# Patient Record
Sex: Male | Born: 1960 | Race: Black or African American | Hispanic: No | Smoking: Former smoker
Health system: Southern US, Community
[De-identification: ages and names within clinical notes are randomized; demographics above are authoritative.]

## PROBLEM LIST (undated history)

## (undated) DIAGNOSIS — I82409 Acute embolism and thrombosis of unspecified deep veins of unspecified lower extremity: Secondary | ICD-10-CM

## (undated) DIAGNOSIS — C801 Malignant (primary) neoplasm, unspecified: Secondary | ICD-10-CM

## (undated) DIAGNOSIS — I10 Essential (primary) hypertension: Secondary | ICD-10-CM

## (undated) DIAGNOSIS — C3490 Malignant neoplasm of unspecified part of unspecified bronchus or lung: Secondary | ICD-10-CM

## (undated) DIAGNOSIS — R569 Unspecified convulsions: Secondary | ICD-10-CM

## (undated) HISTORY — PX: HIP SURGERY: SHX245

---

## 2016-09-01 ENCOUNTER — Emergency Department: Payer: Medicaid Other

## 2016-09-01 ENCOUNTER — Emergency Department
Admission: EM | Admit: 2016-09-01 | Discharge: 2016-09-02 | Disposition: A | Discharge status: Other Acute Inpatient Hospital | Payer: Medicaid Other | Attending: Emergency Medicine | Admitting: Emergency Medicine

## 2016-09-01 DIAGNOSIS — R569 Unspecified convulsions: Secondary | ICD-10-CM | POA: Insufficient documentation

## 2016-09-01 DIAGNOSIS — Z5181 Encounter for therapeutic drug level monitoring: Secondary | ICD-10-CM | POA: Insufficient documentation

## 2016-09-01 DIAGNOSIS — Z87891 Personal history of nicotine dependence: Secondary | ICD-10-CM | POA: Insufficient documentation

## 2016-09-01 DIAGNOSIS — C7931 Secondary malignant neoplasm of brain: Secondary | ICD-10-CM | POA: Diagnosis not present

## 2016-09-01 DIAGNOSIS — G936 Cerebral edema: Secondary | ICD-10-CM | POA: Diagnosis not present

## 2016-09-01 DIAGNOSIS — Z85118 Personal history of other malignant neoplasm of bronchus and lung: Secondary | ICD-10-CM | POA: Insufficient documentation

## 2016-09-01 DIAGNOSIS — I10 Essential (primary) hypertension: Secondary | ICD-10-CM | POA: Diagnosis not present

## 2016-09-01 HISTORY — DX: Acute embolism and thrombosis of unspecified deep veins of unspecified lower extremity: I82.409

## 2016-09-01 HISTORY — DX: Malignant neoplasm of unspecified part of unspecified bronchus or lung: C34.90

## 2016-09-01 HISTORY — DX: Essential (primary) hypertension: I10

## 2016-09-01 HISTORY — DX: Malignant (primary) neoplasm, unspecified: C80.1

## 2016-09-01 LAB — GLUCOSE, CAPILLARY: GLUCOSE-CAPILLARY: 205 mg/dL — AB (ref 65–99)

## 2016-09-01 MED ORDER — IPRATROPIUM-ALBUTEROL 0.5-2.5 (3) MG/3ML IN SOLN
3.0000 mL | Freq: Once | RESPIRATORY_TRACT | Status: AC
  Administered 2016-09-01: 3 mL via RESPIRATORY_TRACT

## 2016-09-01 MED ORDER — LORAZEPAM 2 MG/ML IJ SOLN
2.0000 mg | Freq: Once | INTRAMUSCULAR | Status: AC
  Administered 2016-09-01: 2 mg via INTRAVENOUS

## 2016-09-01 MED ORDER — LORAZEPAM 2 MG/ML IJ SOLN: 2.0000 mg | Freq: Once | INTRAMUSCULAR | Status: DC

## 2016-09-01 NOTE — ED Triage Notes (Signed)
Pt from home via [facility] with status epilepticus. Pt is a stage IV lung cancer pt who has recently been experiencing seizures. Per EMS, pt was administered '2mg'$  Versed intranasally and '4mg'$  Versed IM in the left deltoid. EMS IV attempts x2 on left arm. EMS also reports CBG od 224 and BP 170/100. At this time, pt in unresponsive to verbal stimulation but responsive to pain.

## 2016-09-02 ENCOUNTER — Encounter: Payer: Self-pay | Admitting: Emergency Medicine

## 2016-09-02 LAB — URINALYSIS, ROUTINE W REFLEX MICROSCOPIC
Bilirubin Urine: NEGATIVE
GLUCOSE, UA: NEGATIVE mg/dL
Hgb urine dipstick: NEGATIVE
Ketones, ur: NEGATIVE mg/dL
LEUKOCYTES UA: NEGATIVE
Nitrite: NEGATIVE
PH: 6 (ref 5.0–8.0)
PROTEIN: NEGATIVE mg/dL
SPECIFIC GRAVITY, URINE: 1.013 (ref 1.005–1.030)

## 2016-09-02 LAB — BLOOD GAS, ARTERIAL
ACID-BASE DEFICIT: 3.2 mmol/L — AB (ref 0.0–2.0)
Bicarbonate: 22.7 mmol/L (ref 20.0–28.0)
Delivery systems: POSITIVE
Expiratory PAP: 5
FIO2: 0.3
Inspiratory PAP: 10
O2 SAT: 92.2 %
PATIENT TEMPERATURE: 37
PCO2 ART: 43 mmHg (ref 32.0–48.0)
PO2 ART: 69 mmHg — AB (ref 83.0–108.0)
pH, Arterial: 7.33 — ABNORMAL LOW (ref 7.350–7.450)

## 2016-09-02 LAB — COMPREHENSIVE METABOLIC PANEL
ALBUMIN: 2.2 g/dL — AB (ref 3.5–5.0)
ALK PHOS: 50 U/L (ref 38–126)
ALT: 14 U/L — AB (ref 17–63)
AST: 30 U/L (ref 15–41)
Anion gap: 12 (ref 5–15)
BUN: 6 mg/dL (ref 6–20)
CALCIUM: 6.2 mg/dL — AB (ref 8.9–10.3)
CHLORIDE: 120 mmol/L — AB (ref 101–111)
CO2: 12 mmol/L — AB (ref 22–32)
CREATININE: 0.59 mg/dL — AB (ref 0.61–1.24)
GFR calc Af Amer: >60 mL/min (ref >60)
GFR calc non Af Amer: >60 mL/min (ref >60)
GLUCOSE: 166 mg/dL — AB (ref 65–99)
Potassium: 2 mmol/L — CL (ref 3.5–5.1)
SODIUM: 144 mmol/L (ref 135–145)
Total Bilirubin: 0.4 mg/dL (ref 0.3–1.2)
Total Protein: 4.2 g/dL — ABNORMAL LOW (ref 6.5–8.1)

## 2016-09-02 LAB — CBC WITH DIFFERENTIAL/PLATELET
BASOS PCT: 0 %
Basophils Absolute: 0 10*3/uL (ref 0–0.1)
Eosinophils Absolute: 0.1 10*3/uL (ref 0–0.7)
Eosinophils Relative: 1 %
HCT: 34.3 % — ABNORMAL LOW (ref 40.0–52.0)
HEMOGLOBIN: 11.2 g/dL — AB (ref 13.0–18.0)
Lymphocytes Relative: 24 %
Lymphs Abs: 2.7 10*3/uL (ref 1.0–3.6)
MCH: 30.2 pg (ref 26.0–34.0)
MCHC: 32.7 g/dL (ref 32.0–36.0)
MCV: 92.3 fL (ref 80.0–100.0)
MONOS PCT: 5 %
Monocytes Absolute: 0.5 10*3/uL (ref 0.2–1.0)
NEUTROS ABS: 7.9 10*3/uL — AB (ref 1.4–6.5)
NEUTROS PCT: 70 %
PLATELETS: 201 10*3/uL (ref 150–440)
RBC: 3.71 MIL/uL — AB (ref 4.40–5.90)
RDW: 19.5 % — ABNORMAL HIGH (ref 11.5–14.5)
WBC: 11.3 10*3/uL — AB (ref 3.8–10.6)

## 2016-09-02 LAB — PROTIME-INR
INR: 1.95
Prothrombin Time: 22.5 seconds — ABNORMAL HIGH (ref 11.4–15.2)

## 2016-09-02 LAB — APTT: aPTT: 31 seconds (ref 24–36)

## 2016-09-02 LAB — TROPONIN I

## 2016-09-02 LAB — LACTIC ACID, PLASMA
LACTIC ACID, VENOUS: 2.3 mmol/L — AB (ref 0.5–1.9)
Lactic Acid, Venous: 7.5 mmol/L (ref 0.5–1.9)

## 2016-09-02 MED ORDER — SODIUM CHLORIDE 0.9 % IV BOLUS (SEPSIS)
1000.0000 mL | Freq: Once | INTRAVENOUS | Status: AC
  Administered 2016-09-02: 1000 mL via INTRAVENOUS

## 2016-09-02 MED ORDER — IPRATROPIUM-ALBUTEROL 0.5-2.5 (3) MG/3ML IN SOLN
RESPIRATORY_TRACT | Status: AC
  Filled 2016-09-02: qty 3

## 2016-09-02 MED ORDER — SODIUM CHLORIDE 0.9 % IV SOLN: 1.0000 g | Freq: Once | INTRAVENOUS | Status: DC

## 2016-09-02 MED ORDER — SODIUM CHLORIDE 0.9 % IV SOLN
Freq: Once | INTRAVENOUS | Status: AC
  Administered 2016-09-02: 02:00:00 via INTRAVENOUS
  Filled 2016-09-02: qty 1000

## 2016-09-02 MED ORDER — IPRATROPIUM-ALBUTEROL 0.5-2.5 (3) MG/3ML IN SOLN
3.0000 mL | Freq: Once | RESPIRATORY_TRACT | Status: AC
  Administered 2016-09-02: 3 mL via RESPIRATORY_TRACT

## 2016-09-02 MED ORDER — SODIUM CHLORIDE 0.9 % IV SOLN
1.0000 g | Freq: Once | INTRAVENOUS | Status: AC
  Administered 2016-09-02: 1 g via INTRAVENOUS
  Filled 2016-09-02: qty 10

## 2016-09-02 MED ORDER — DEXAMETHASONE SODIUM PHOSPHATE 10 MG/ML IJ SOLN
10.0000 mg | Freq: Once | INTRAMUSCULAR | Status: AC
  Administered 2016-09-02: 10 mg via INTRAVENOUS
  Filled 2016-09-02: qty 1

## 2016-09-02 NOTE — ED Provider Notes (Signed)
[facility] Emergency Department Provider Note   ____________________________________________   First MD Initiated Contact with Patient 09/01/16 2334     (approximate)  I have reviewed the triage vital signs and the nursing notes.   HISTORY  Chief Complaint Seizures  Patient is post ictal and unable to answer questions at this time  HPI Shane Clayton is a 56 y.o. male who comes into the hospital today for seizures. According to EMS he has a history of stage IV lung cancer and a recent onset of seizures. They report they are unsure exactly when this started but no that they are currently being evaluated by the patient's physician. Tonight the patient started seizing and according to EMS was in status epilepticus. He was seizing about 15 minutes prior to EMS arrival and then seized for another 15 minutes before they stopped. The patient received 2 mg of Versed intranasally and then 4 mg intramuscularly. EMS was unable to get an IV prior to the patient arriving to the emergency department. He does have a Port-A-Cath in his chest. He is here today for evaluation. EMS reports that they did not get more history as the patient's family was vague with the information.   Past Medical History:  Diagnosis Date  . Cancer (HCC)    Level IV lung cancer  . DVT (deep venous thrombosis) ([place])   . Hypertension   . Non-small cell lung cancer (NSCLC) ([place])     There are no active problems to display for this patient.   Past Surgical History:  Procedure Laterality Date  . HIP SURGERY      Prior to Admission medications   Not on File    Allergies Patient has no known allergies.  No family history on file.  Social History Social History  Substance Use Topics  . Smoking status: Former Research scientist (life sciences)  . Smokeless tobacco: Never Used  . Alcohol use No    Review of Systems Unable to assess due to patient being post ictal  10-point ROS otherwise  negative.  ____________________________________________   PHYSICAL EXAM:  VITAL SIGNS: ED Triage Vitals  Enc Vitals Group     BP                                          09/02/16  0020 106/69     Pulse                                      09/02/16  0020 117     Resp                                      09/02/16  0020 24     Temp                                      09/02/16  0020 99.4     Temp src      SpO2  09/02/16  0020 92%     Weight      Height      Head Circumference      Peak Flow      Pain Score      Pain Loc      Pain Edu?      Excl. in [place]?     Constitutional: Somnolent and postictal.  Eyes: Conjunctivae are normal. Pupils pinpoint bilaterally Head: Atraumatic. Nose: No congestion/rhinnorhea. Mouth/Throat: Mucous membranes are moist.  Oropharynx non-erythematous. Cardiovascular: Tachycardia, regular rhythm. Grossly normal heart sounds.  Good peripheral circulation. Respiratory: Normal respiratory effort.  Subcostal retractions. Expiratory wheezes throughout all lung fields Gastrointestinal: Soft and nontender. Mild distention. Positive bowel sounds Musculoskeletal: No lower extremity tenderness nor edema.   Neurologic:  Patient with sonorous respirations and somnolent, postictal  Skin:  Skin is warm, dry and intact.  Psychiatric: Mood and affect are normal.   ____________________________________________   LABS (all labs ordered are listed, but only abnormal results are displayed)  Labs Reviewed  LACTIC ACID, PLASMA - Abnormal; Notable for the following:       Result Value   Lactic Acid, Venous 7.5 (*)    All other components within normal limits  LACTIC ACID, PLASMA - Abnormal; Notable for the following:    Lactic Acid, Venous 2.3 (*)    All other components within normal limits  CBC WITH DIFFERENTIAL/PLATELET - Abnormal; Notable for the following:    WBC 11.3 (*)    RBC 3.71 (*)    Hemoglobin 11.2 (*)    HCT 34.3  (*)    RDW 19.5 (*)    Neutro Abs 7.9 (*)    All other components within normal limits  COMPREHENSIVE METABOLIC PANEL - Abnormal; Notable for the following:    Potassium 2.0 (*)    Chloride 120 (*)    CO2 12 (*)    Glucose, Bld 166 (*)    Creatinine, Ser 0.59 (*)    Calcium 6.2 (*)    Total Protein 4.2 (*)    Albumin 2.2 (*)    ALT 14 (*)    All other components within normal limits  PROTIME-INR - Abnormal; Notable for the following:    Prothrombin Time 22.5 (*)    All other components within normal limits  URINALYSIS, ROUTINE W REFLEX MICROSCOPIC - Abnormal; Notable for the following:    Color, Urine YELLOW (*)    APPearance CLEAR (*)    All other components within normal limits  GLUCOSE, CAPILLARY - Abnormal; Notable for the following:    Glucose-Capillary 205 (*)    All other components within normal limits  BLOOD GAS, ARTERIAL - Abnormal; Notable for the following:    pH, Arterial 7.33 (*)    pO2, Arterial 69 (*)    Acid-base deficit 3.2 (*)    All other components within normal limits  CULTURE, BLOOD (ROUTINE X 2)  CULTURE, BLOOD (ROUTINE X 2)  TROPONIN I  APTT  CALCIUM, IONIZED  CBG MONITORING, ED   ____________________________________________  EKG  ED ECG REPORT I, Loney Hering, the attending physician, personally viewed and interpreted this ECG.   Date: 09/01/2016  EKG Time: 2343  Rate: 123  Rhythm: sinus tachycardia  Axis: normal  Intervals:none  ST&T Change: none  ____________________________________________  RADIOLOGY  CT head CXR ____________________________________________   PROCEDURES  Procedure(s) performed: None  Procedures  Critical Care performed: Yes, see critical care note(s)   CRITICAL CARE Performed by: Charlesetta Ivory P  Total critical care time: 60 minutes  Critical care time was exclusive of separately billable procedures and treating other patients.  Critical care was necessary to treat or prevent  imminent or life-threatening deterioration.  Critical care was time spent personally by me on the following activities: development of treatment plan with patient and/or surrogate as well as nursing, discussions with consultants, evaluation of patient's response to treatment, examination of patient, obtaining history from patient or surrogate, ordering and performing treatments and interventions, ordering and review of laboratory studies, ordering and review of radiographic studies, pulse oximetry and re-evaluation of patient's condition.   ____________________________________________   INITIAL IMPRESSION / ASSESSMENT AND PLAN / ED COURSE  Pertinent labs & imaging results that were available during my care of the patient were reviewed by me and considered in my medical decision making (see chart for details).  This is a 56 year old male who comes into the hospital today with seizures. 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 We will check some blood work and a CT of his head. The patient did have some left-sided focal seizure initially on his arrival so we did give him 2 mg of Ativan IV. The patient's seizure did stop prior to the Ativan.  Clinical Course as of Sep 02 552  Sun Sep 02, 2016  0027 Bibasilar opacities favoring atelectasis over infiltrate. DG Chest Port 1 View [AW]  [phone] IMPRESSION: Bilateral white matter edema, greater on the right, with focal low-attenuation areas consistent with intracranial metastasis.  Sulcal effacement without midline shift or herniation   CT Head Wo Contrast [AW]    Clinical Course User Index [AW] Loney Hering, MD   She received 2 DuoNeb treatments while he was here given his wheezes. He was very somnolent and sonorous so he did have some desaturations. After some time in the emergency department he became more arousable but he would fall asleep when he was not being interacted with actively. We did  place the patient on BiPAP as it does appear that he has obstructive sleep apnea. I contacted [facility] who stated they did not have any medical ICU beds and given that the patient was on BiPAP he would need a medical ICU bed. I also contacted our oncologist who felt that we could not do anything for this patient with edema from the medical oncology standpoint. She felt that since he was treated at [facility] and they had more interventions available that he should be transferred back over to [facility]. I did speak again to Dr. Sharlet Salina at [facility] and he stated that we could watch the patient on the BiPAP while he was asleep and see if we could take him off so that the patient could go to a floor bed. I did have our respiratory therapists eventually switch the patient from BiPAP to CPAP and the patient continued to do well with sats in the low 90s. We recontacted [facility] and they will accept the patient to the emergency department for further evaluation. The patient again complains of no pain but he could not tell me much else of what occurred today. He does continue to fall asleep when he is not participating in an active interaction. The patient will be transferred.  ____________________________________________   FINAL CLINICAL IMPRESSION(S) / ED DIAGNOSES  Final diagnoses:  Seizures ([place])  Cerebral edema (HCC)  Brain metastases (HCC)      NEW MEDICATIONS STARTED DURING THIS VISIT:  New Prescriptions  No medications on file     Note:  This document was prepared using Dragon voice recognition software and may include unintentional dictation errors.    Loney Hering, MD 09/02/16 [phone]

## 2016-09-02 NOTE — ED Notes (Signed)
Pt is responding to loud voice at this time. Pt is able to state that he is in the hospital and "fine".

## 2016-09-02 NOTE — ED Notes (Signed)
Pt asking for urinal at this time and able to use with one assist.

## 2016-09-04 LAB — CALCIUM, IONIZED

## 2016-09-07 LAB — CULTURE, BLOOD (ROUTINE X 2)
Culture: NO GROWTH
Culture: NO GROWTH

## 2016-11-11 ENCOUNTER — Emergency Department
Admission: EM | Admit: 2016-11-11 | Discharge: 2016-11-11 | Disposition: A | Discharge status: Home / Self Care | Payer: Medicaid Other | Attending: Emergency Medicine | Admitting: Emergency Medicine

## 2016-11-11 ENCOUNTER — Encounter: Payer: Self-pay | Admitting: Emergency Medicine

## 2016-11-11 DIAGNOSIS — Z87891 Personal history of nicotine dependence: Secondary | ICD-10-CM | POA: Diagnosis not present

## 2016-11-11 DIAGNOSIS — Z85118 Personal history of other malignant neoplasm of bronchus and lung: Secondary | ICD-10-CM | POA: Diagnosis not present

## 2016-11-11 DIAGNOSIS — G40209 Localization-related (focal) (partial) symptomatic epilepsy and epileptic syndromes with complex partial seizures, not intractable, without status epilepticus: Secondary | ICD-10-CM | POA: Diagnosis not present

## 2016-11-11 DIAGNOSIS — I10 Essential (primary) hypertension: Secondary | ICD-10-CM | POA: Diagnosis not present

## 2016-11-11 DIAGNOSIS — G40909 Epilepsy, unspecified, not intractable, without status epilepticus: Secondary | ICD-10-CM | POA: Diagnosis present

## 2016-11-11 DIAGNOSIS — G40309 Generalized idiopathic epilepsy and epileptic syndromes, not intractable, without status epilepticus: Secondary | ICD-10-CM | POA: Insufficient documentation

## 2016-11-11 DIAGNOSIS — R569 Unspecified convulsions: Secondary | ICD-10-CM | POA: Insufficient documentation

## 2016-11-11 HISTORY — DX: Unspecified convulsions: R56.9

## 2016-11-11 HISTORY — DX: Malignant neoplasm of unspecified part of unspecified bronchus or lung: C34.90

## 2016-11-11 LAB — CBC WITH DIFFERENTIAL/PLATELET
BASOS ABS: 0.1 10*3/uL (ref 0–0.1)
BASOS PCT: 1 %
Eosinophils Absolute: 0 10*3/uL (ref 0–0.7)
Eosinophils Relative: 0 %
HEMATOCRIT: 32.6 % — AB (ref 40.0–52.0)
HEMOGLOBIN: 10.9 g/dL — AB (ref 13.0–18.0)
Lymphocytes Relative: 10 %
Lymphs Abs: 1.1 10*3/uL (ref 1.0–3.6)
MCH: 30.8 pg (ref 26.0–34.0)
MCHC: 33.6 g/dL (ref 32.0–36.0)
MCV: 91.6 fL (ref 80.0–100.0)
Monocytes Absolute: 0.6 10*3/uL (ref 0.2–1.0)
Monocytes Relative: 5 %
NEUTROS ABS: 9.6 10*3/uL — AB (ref 1.4–6.5)
Neutrophils Relative %: 84 %
Platelets: 204 10*3/uL (ref 150–440)
RBC: 3.55 MIL/uL — ABNORMAL LOW (ref 4.40–5.90)
RDW: 18.6 % — AB (ref 11.5–14.5)
WBC: 11.4 10*3/uL — ABNORMAL HIGH (ref 3.8–10.6)

## 2016-11-11 LAB — COMPREHENSIVE METABOLIC PANEL
ALBUMIN: 3.1 g/dL — AB (ref 3.5–5.0)
ALK PHOS: 136 U/L — AB (ref 38–126)
ALT: 27 U/L (ref 17–63)
AST: 33 U/L (ref 15–41)
Anion gap: 15 (ref 5–15)
BILIRUBIN TOTAL: 0.4 mg/dL (ref 0.3–1.2)
BUN: 11 mg/dL (ref 6–20)
CALCIUM: 8.7 mg/dL — AB (ref 8.9–10.3)
CO2: 26 mmol/L (ref 22–32)
Chloride: 98 mmol/L — ABNORMAL LOW (ref 101–111)
Creatinine, Ser: 0.89 mg/dL (ref 0.61–1.24)
GFR calc Af Amer: >60 mL/min (ref >60)
GFR calc non Af Amer: >60 mL/min (ref >60)
GLUCOSE: 263 mg/dL — AB (ref 65–99)
POTASSIUM: 3.6 mmol/L (ref 3.5–5.1)
Sodium: 139 mmol/L (ref 135–145)
TOTAL PROTEIN: 5.9 g/dL — AB (ref 6.5–8.1)

## 2016-11-11 MED ORDER — DIPHENHYDRAMINE HCL 50 MG/ML IJ SOLN
25.0000 mg | Freq: Once | INTRAMUSCULAR | Status: AC
  Administered 2016-11-11: 25 mg via INTRAVENOUS
  Filled 2016-11-11: qty 1

## 2016-11-11 MED ORDER — LORAZEPAM 0.5 MG PO TABS: 0.5000 mg | ORAL_TABLET | Freq: Three times a day (TID) | ORAL | 0 refills | Status: AC | PRN

## 2016-11-11 MED ORDER — LORAZEPAM 2 MG/ML IJ SOLN
1.0000 mg | Freq: Once | INTRAMUSCULAR | Status: AC
  Administered 2016-11-11: 1 mg via INTRAVENOUS
  Filled 2016-11-11: qty 1

## 2016-11-11 MED ORDER — HEPARIN SOD (PORK) LOCK FLUSH 100 UNIT/ML IV SOLN
500.0000 [IU] | Freq: Once | INTRAVENOUS | Status: AC
  Administered 2016-11-11: 500 [IU] via INTRAVENOUS
  Filled 2016-11-11: qty 5

## 2016-11-11 NOTE — ED Provider Notes (Signed)
[facility] Emergency Department Provider Note  ____________________________________________  Time seen: Approximately 10:17 PM  I have reviewed the triage vital signs and the nursing notes.   HISTORY  Chief Complaint Seizures (Mild Clonic)    HPI Shane Clayton is a 56 y.o. male who complains of recurrence of left upper extremity twitching that is than off and on for more than a year according the patient. This started again this afternoon. Denies any headache or vision changes. No weakness or paresthesias. States that it is due to epilepsy, takes Keppra and was recently started on phenytoin as well. No other new medications or changes to medications. He's been compliant with medications. No recent illness.    Hospital summary from recent [place] admission 10/18/16: Hospital Course: #Partial status epilepticus #Cerebral Edema Likely secondary to increased cerebral edema/disease progression seen on CTH vs drug related (nivolumab) or infection. Continuous EEG in NeuroICU was abnormal due to mild diffuse background slowing and frequesnt right central sharps. RadOnc consulted and stated ddx to include progressive disease vs radionecrosis vs pseudoprogression. Did not feel that radiation was warranted however recommended MRI and potential NSU involvement. MRI notable for significant increase in vasogenic edema possibly due to worsening disease vs radiation changes. Case reviewed by Neurosurgery (Dr. Brett Albino) and Radiation Oncology (Dr. Leonel Ramsay) and given pt's poor PS and trajectory of disease plan is to pursue best supportive care/ hospice. He was discharged on Keppra '750mg'$  q12h, Dilantin '300mg'$  qhs, Vimpat '100mg'$  q12h and prednisone '60mg'$  BID.   #Encephalopathy This is likely postictal in conjunction with long acting benzodiazopines administation by EMS and ED. During this admission sedating medications were avoided given presentation. His mental status continued to wax and  wane throughout hospitalization.   #NSCLC with Metastatic Brain Lesion Patient currently on Nivolumab with last dose in 09/2016. He's s/p XRT to brain in 06/2014 and [place] brain 06/2016. The patient and his wife have agreed on home with hospice and DNAR status. His primary oncologist Dr. Aniceto Boss is in agreement with this plan.   #Sinus Tachycardia - resolved  Patient with an episode of sinus tachycardia (100-120's) on 3/2 which improved after IV metoprolol 2.'5mg'$ . During this even, he did not experience chest pain, palpitations or shortness of breath. At the time of discharge he was in normal rate and rhythm.   #Drug induced hyperglycemia - resolved While in the ICU, patient with drug induced hyperglycemia secondary to high dose IV steroids. He was placed on 2 units regular aq6h while receiving high dose IV steroids. Patient transitioned to PO prednisone prior to discharge and no longer requires insulin.   #H/o DVT Patient's home regimen consisted of Xarelto '20mg'$  daily which was initially held due to elevated INR. Xarelto was restarted in Neurology ICU without signs or symptoms of bleeding.   #Hx Chronic pain During this admission, the patient's home Methadone dose was decrease from '5mg'$  q8h to 2.'5mg'$  q8h in the setting of neurologic symptoms. He remained stable on this dose and pain was well controlled. He was discharged on Methadone 2.'5mg'$  q8h.  #PCP PPx:  Patient was previously on Bactrim at home but stopped due to side effects. Atovaquone started in hospital which patient seemed to tolerate well. He was discharged on atovaquone '1500mg'$  daily as he's being discharged on steroids.   #Moderate Protein Calorie Malnutrition  Patient with a 7.4% weight loss within the last week likely secondary to consuming <75% of his estimated needs for > 7 days. As patient is being discharged with home hospice,  aggressive nutrition intervention is not indicated at this time.   Status on Discharge:  Cognitive:  altered Functional (ADLS/Mobility): requires assistance  Current Activity: Chairfast (10/26/16 0907) Current Mobility: Slightly limited (10/26/16 7209)  Code Status: DNAR       Past Medical History:  Diagnosis Date  . Cancer (HCC)    Level IV lung cancer  . Clonic seizure ([place])   . Clonic seizure ([place])   . DVT (deep venous thrombosis) ([place])   . Hypertension   . Lung cancer ([place])   . Lung cancer ([place])   . Non-small cell lung cancer (NSCLC) [facility])      Patient Active Problem List   Diagnosis Date Noted  . Clonic seizure [facility])      Past Surgical History:  Procedure Laterality Date  . HIP SURGERY       Prior to Admission medications   Medication Sig Start Date End Date Taking? Authorizing Provider  LORazepam (ATIVAN) 0.5 MG tablet Take 1 tablet (0.5 mg total) by mouth every 8 (eight) hours as needed for seizure. 11/11/16 11/11/17  Carrie Mew, MD     Allergies Peanut-containing drug products   History reviewed. No pertinent family history.  Social History Social History  Substance Use Topics  . Smoking status: Former Research scientist (life sciences)  . Smokeless tobacco: Never Used  . Alcohol use No    Review of Systems  Constitutional:   No fever or chills.  ENT:   No sore throat. No rhinorrhea. Cardiovascular:   No chest pain. Respiratory:   No dyspnea or cough. Gastrointestinal:   Negative for abdominal pain, vomiting and diarrhea.  Genitourinary:   Negative for dysuria or difficulty urinating. Musculoskeletal:   Negative for focal pain or swelling Neurological:   Negative for headaches. Positive involuntary rhythmic twitching of left shoulder 10-point ROS otherwise negative.  ____________________________________________   PHYSICAL EXAM:  VITAL SIGNS: ED Triage Vitals  Enc Vitals Group     BP 11/11/16 2017 (!) 111/93     Pulse Rate 11/11/16 2017 (!) 101     Resp 11/11/16 2017 20     Temp 11/11/16 2017 98.4 F (36.9 C)     Temp Source 11/11/16 2017 Oral      SpO2 11/11/16 2017 95 %     Weight 11/11/16 2017 140 lb (63.5 kg)     Height 11/11/16 2017 '5\' 4"'$  (1.626 m)     Head Circumference --      Peak Flow --      Pain Score 11/11/16 2018 8     Pain Loc --      Pain Edu? --      Excl. in [place]? --     Vital signs reviewed, nursing assessments reviewed.   Constitutional:   Alert and oriented. Well appearing and in no distress. Eyes:   No scleral icterus. No conjunctival pallor. PERRL. EOMI.  No nystagmus. ENT   Head:   Normocephalic and atraumatic.   Nose:   No congestion/rhinnorhea. No septal hematoma   Mouth/Throat:   MMM, no pharyngeal erythema. No peritonsillar mass.    Neck:   No stridor. No SubQ emphysema. No meningismus. Hematological/Lymphatic/Immunilogical:   No cervical lymphadenopathy. Cardiovascular:   Slight tachycardia of 100. Symmetric bilateral radial and DP pulses.  No murmurs.  Respiratory:   Normal respiratory effort without tachypnea nor retractions. Breath sounds are clear and equal bilaterally. No wheezes/rales/rhonchi. Gastrointestinal:   Soft and nontender. Non distended. There is no CVA tenderness.  No rebound, rigidity, or guarding.  Genitourinary:   deferred Musculoskeletal:   Normal range of motion in all extremities. No joint effusions.  No lower extremity tenderness.  No edema. Left shoulder rhythmic twitching contractions. Neurologic:   Normal speech and language.  CN 2-10 normal. Left upper extremity strength 4 out of 5, right upper [place] 5 out of 5. Strength otherwise intact.. No gross focal neurologic deficits are appreciated.  Skin:    Skin is warm, dry and intact. No rash noted.  No petechiae, purpura, or bullae.  ____________________________________________    LABS (pertinent positives/negatives) (all labs ordered are listed, but only abnormal results are displayed) Labs Reviewed  CBC WITH DIFFERENTIAL/PLATELET - Abnormal; Notable for the following:       Result Value   WBC 11.4 (*)     RBC 3.55 (*)    Hemoglobin 10.9 (*)    HCT 32.6 (*)    RDW 18.6 (*)    Neutro Abs 9.6 (*)    All other components within normal limits  COMPREHENSIVE METABOLIC PANEL - Abnormal; Notable for the following:    Chloride 98 (*)    Glucose, Bld 263 (*)    Calcium 8.7 (*)    Total Protein 5.9 (*)    Albumin 3.1 (*)    Alkaline Phosphatase 136 (*)    All other components within normal limits   ____________________________________________   EKG  Interpreted by me Sinus tachycardia rate 100, normal axis and intervals. Normal QRS ST segments and T waves.  ____________________________________________    RADIOLOGY  No results found.  ____________________________________________   PROCEDURES Procedures  ____________________________________________   INITIAL IMPRESSION / ASSESSMENT AND PLAN / ED COURSE  Pertinent labs & imaging results that were available during my care of the patient were reviewed by me and considered in my medical decision making (see chart for details).  The well-appearing no acute distress, presents with twitching movements of his left shoulder. Consistent with previously established partial epilepsy. He is compliant with his antiepileptic medications. Denies any other acute neurological changes, no stroke symptoms.  He recently had an extensive evaluation at [facility], and has recently transitioned his goals of care to hospice. There does not appear to be any benefit to hospitalization at this time. We'll to control his symptoms with benzodiazepines and/or Benadryl, and plan for discharge home. The patient is agreement with this and wants to be discharged home.     Clinical Course as of Nov 11 2313  Nancy Fetter Nov 11, 2016  2311 Twitching resolved. Will DC home. Will provide Rx for low dose ativan to take as needed for recurrent sx.  [PS]    Clinical Course User Index [PS] Carrie Mew, MD     ____________________________________________   FINAL CLINICAL  IMPRESSION(S) / ED DIAGNOSES  Final diagnoses:  Clonic seizure ([place])  Seizure ([place])  Partial status epilepticus    New Prescriptions   LORAZEPAM (ATIVAN) 0.5 MG TABLET    Take 1 tablet (0.5 mg total) by mouth every 8 (eight) hours as needed for seizure.     Portions of this note were generated with dragon dictation software. Dictation errors may occur despite best attempts at proofreading.    Carrie Mew, MD 11/11/16 [id number]

## 2016-11-11 NOTE — ED Notes (Signed)
Pt was apologized to for the wait. Nurse explained that the doctor was unfortunately tied up and would see them as soon as they could. Pt was given urinal per his request to void. Pt's wife remains at bedside.

## 2016-11-11 NOTE — ED Notes (Signed)
Reviewed d/c instructions, follow-up care, prescriptions with patient. Pt verbalized understanding.

## 2016-11-11 NOTE — ED Notes (Signed)
School excuse notes given to family per MD Joni Fears

## 2016-11-11 NOTE — ED Notes (Signed)
Dr. Joni Fears is aware of pt. Pt in NAD at this time.

## 2016-11-11 NOTE — ED Triage Notes (Signed)
Pt brought in by EMS for mild clonic seizure. Pt has a "twitch" in his left arm that has been going on for some time. Pt has hx of lung ca. Pt stating that the "twitch" it comes and goes. Pt stating that this time it has "been going a long while." Pt never LOC. Pt's neuro intact. Pt is aaox4 Pt is nonambulatory and uses w/c.

## 2017-03-20 DEATH — deceased

## 2017-08-25 IMAGING — CT CT HEAD W/O CM
3 series · 15 of 46 positions shown, 18 images · non-contrast
Comparison: None.

CLINICAL DATA: New onset seizures.  Stage IV lung cancer.

EXAM:
CT HEAD WITHOUT CONTRAST
TECHNIQUE: Contiguous axial images were obtained from the base of the skull
through the vertex without intravenous contrast.

[Series 2: head wo · axial · 0.45mm/px · z∈[-164,-44]mm · 9 of 29 slices shown, 12 images]
[im 3/29  brain]
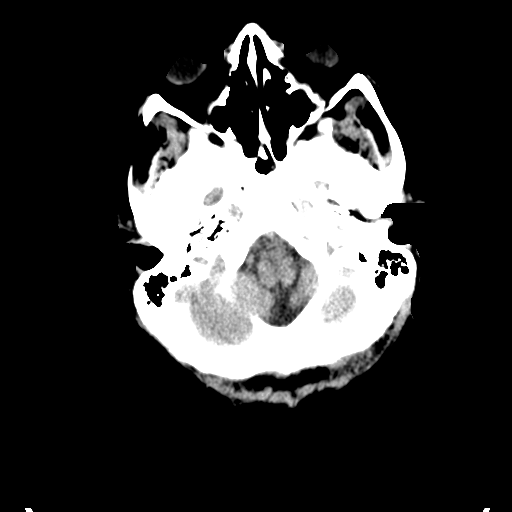
[im 3/29  bone]
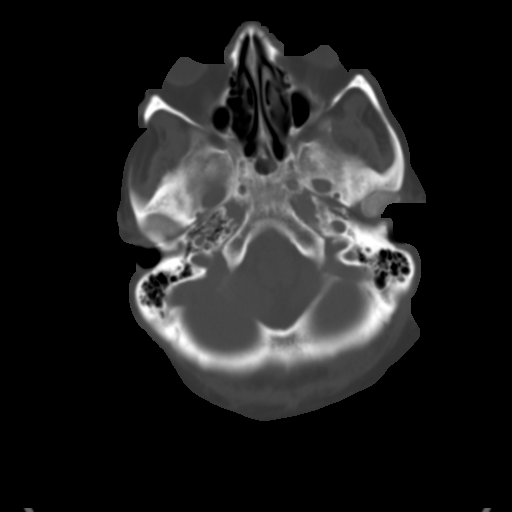
[im 6/29  brain]
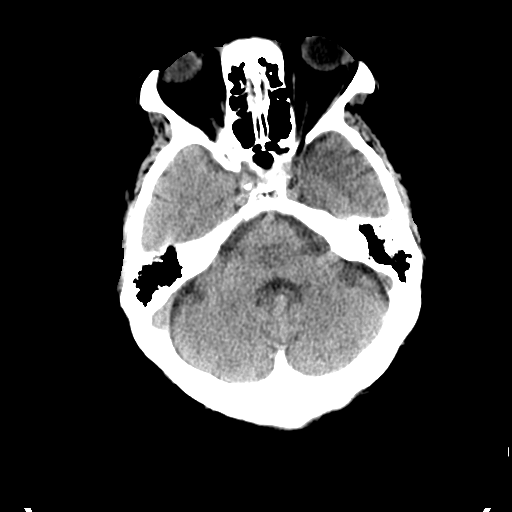
[im 9/29  brain]
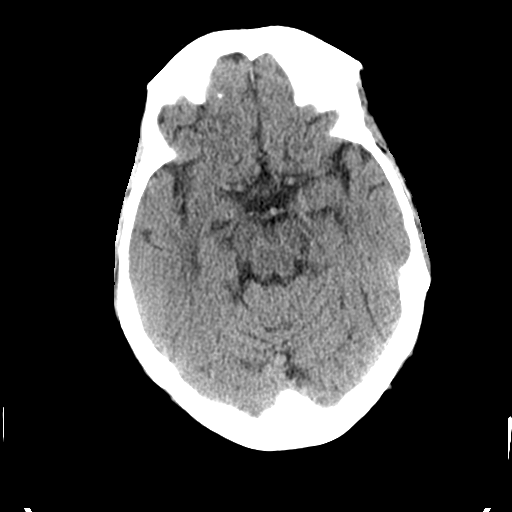
[im 12/29  brain]
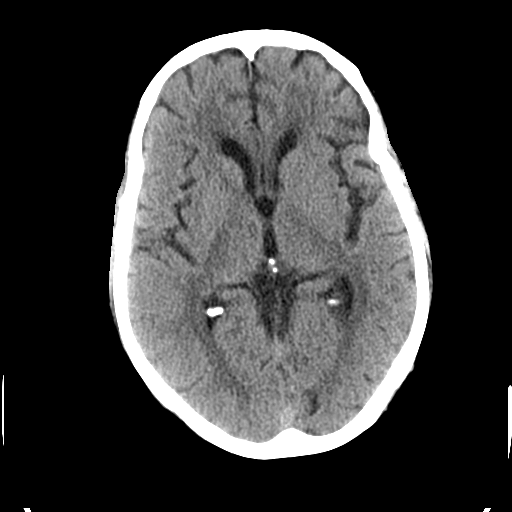
[im 15/29  brain]
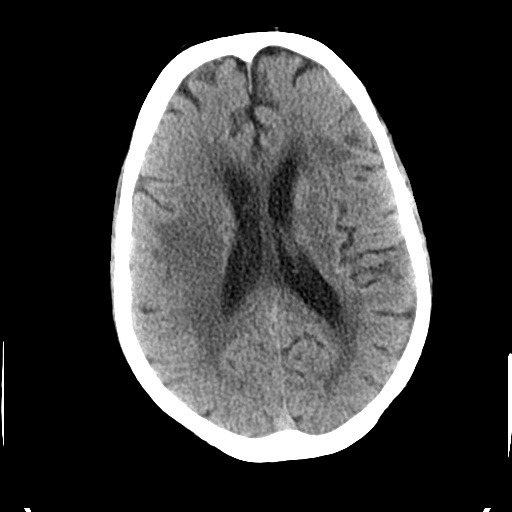
[im 15/29  bone]
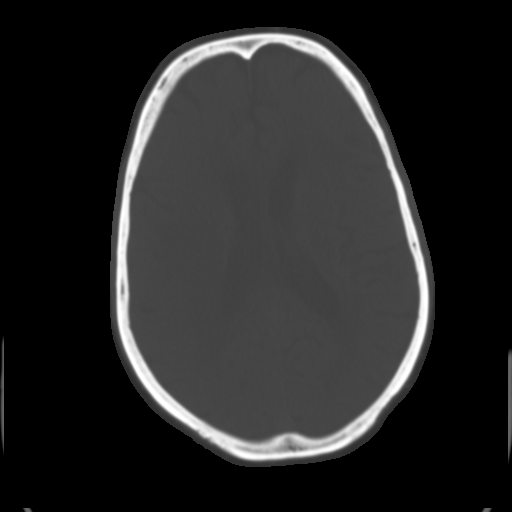
[im 18/29  brain]
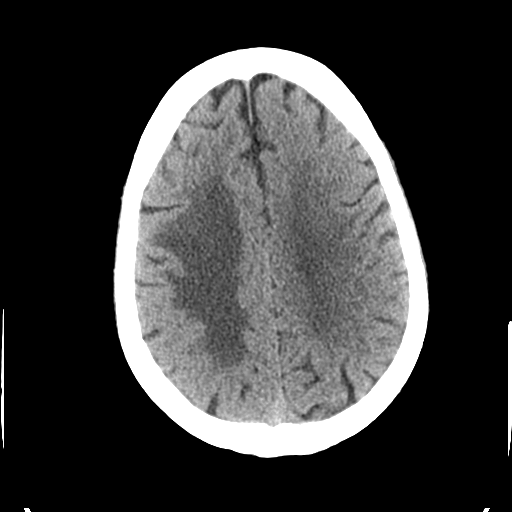
[im 21/29  brain]
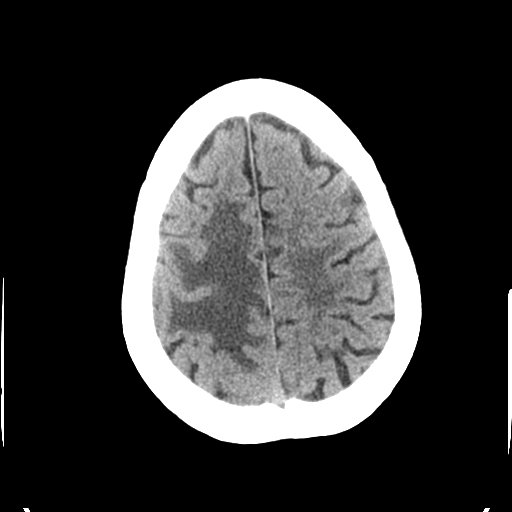
[im 24/29  brain]
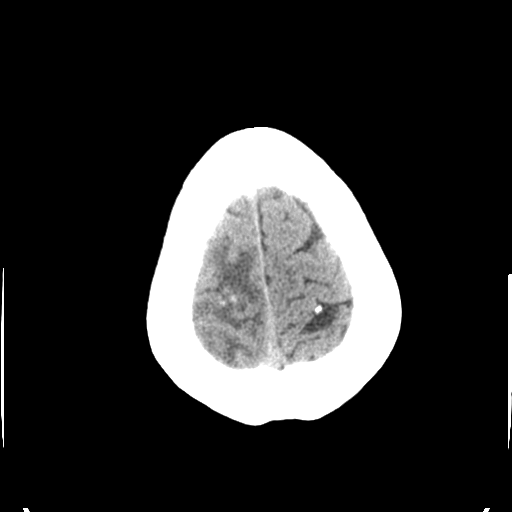
[im 27/29  brain]
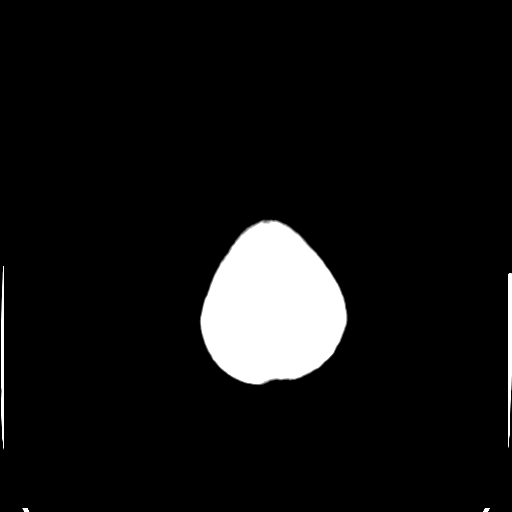
[im 27/29  bone]
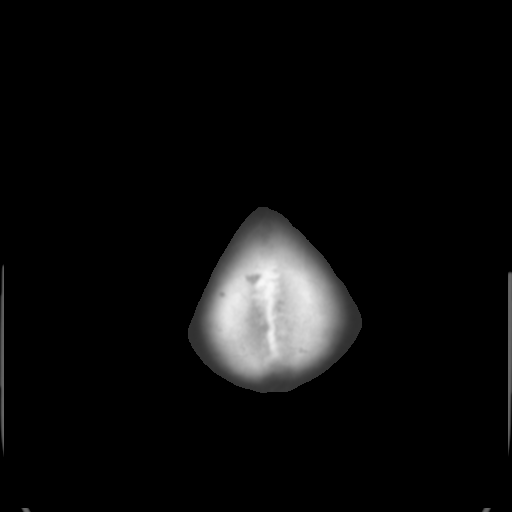

[Series 4: coronal soft tissue · coronal · 0.29mm/px · 3 of 67 slices shown]
[im 23/67  brain]
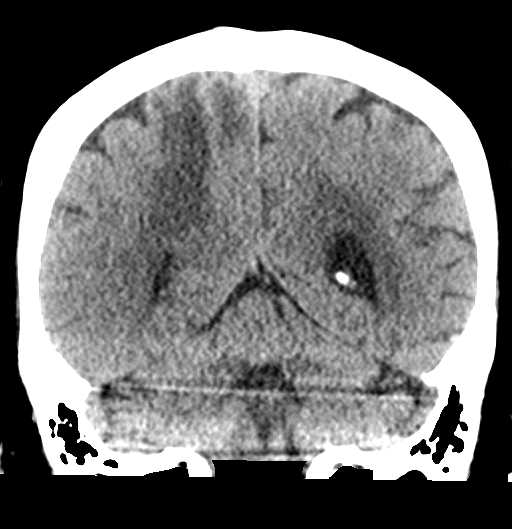
[im 30/67  brain]
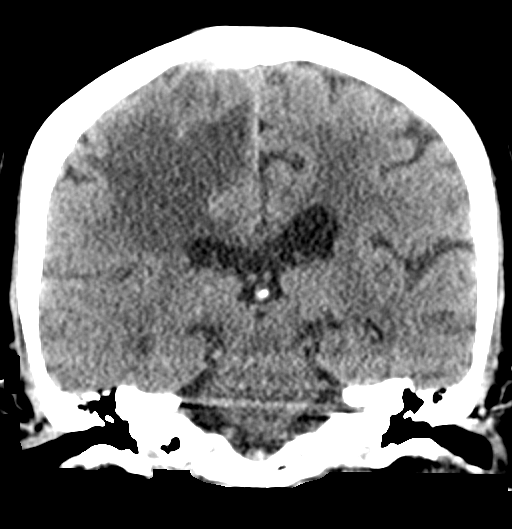
[im 37/67  brain]
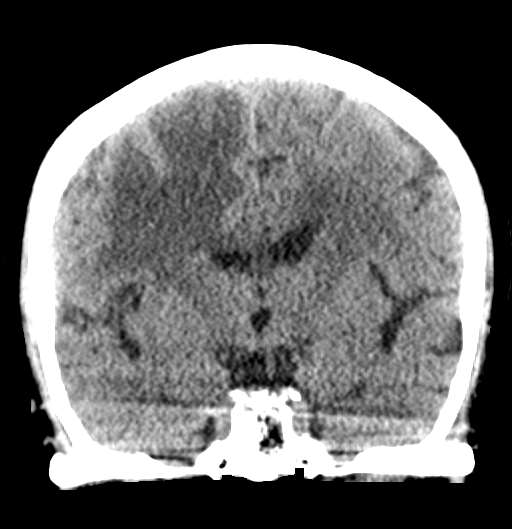

[Series 5: sagittal soft tissue · sagittal · 0.30mm/px · 3 of 51 slices shown]
[im 17/51  brain]
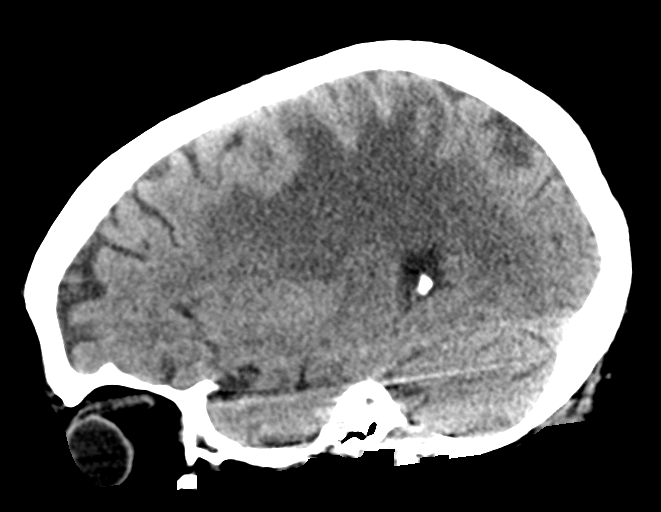
[im 26/51  brain]
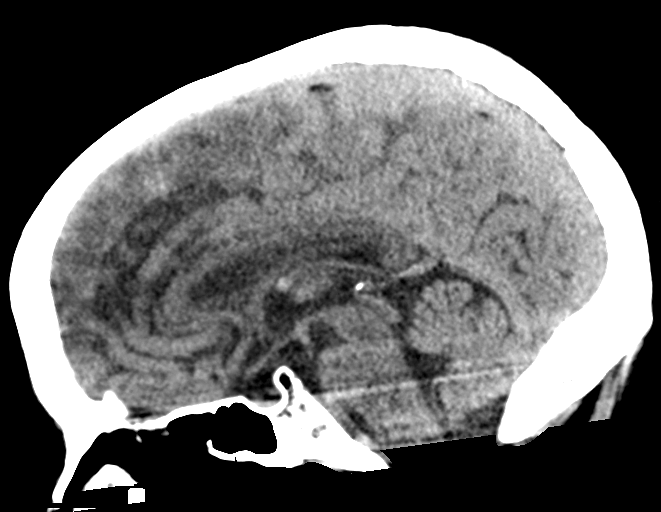
[im 34/51  brain]
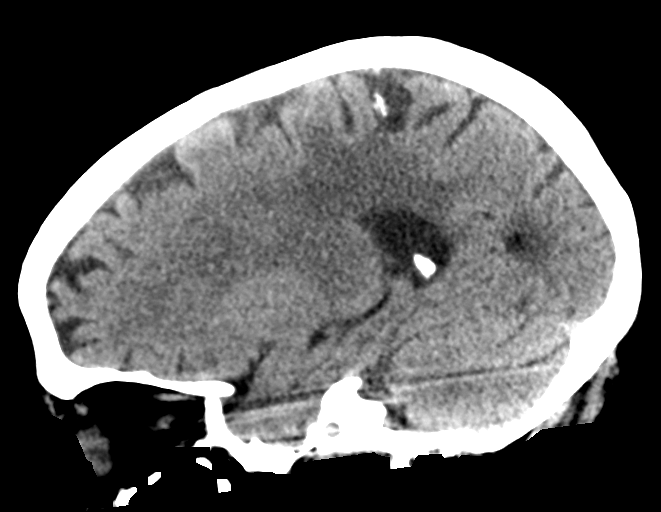

[15 of 46 positions shown; findings below may reference images not displayed]

FINDINGS: Brain: Prominent vasogenic edema demonstrated in the centrum
semiovale bilaterally with focal cortical low-attenuation areas
demonstrated along the convexity of the left parietal lobe and left
frontal lobe. Appearance likely represents intracranial metastasis.
Focal cortical calcifications along the convexity, likely
representing calcification within tumor. There is mass effect with
mild sulcal effacement but no midline shift. Ventricles are not
dilated. No abnormal extra-axial fluid collections. Gray-white
matter junctions remain distinct. Basal cisterns are not effaced. No
acute intracranial hemorrhage.

Vascular: Mild vascular calcifications.

Skull: Calvarium appears intact.

Sinuses/Orbits: Mild mucosal thickening in the paranasal sinuses. No
acute air-fluid levels. Mastoid air cells are not opacified.

Other: None.
IMPRESSION: Bilateral white matter edema, greater on the right, with focal
low-attenuation areas consistent with intracranial metastasis.

Sulcal effacement without midline shift or herniation.
